# Patient Record
Sex: Female | Born: 1970 | Race: White | Hispanic: No | Marital: Married
Health system: Southern US, Community
[De-identification: ages and names within clinical notes are randomized; demographics above are authoritative.]

---

## 1998-01-09 ENCOUNTER — Other Ambulatory Visit: Admission: RE | Admit: 1998-01-09 | Discharge: 1998-01-09 | Payer: Self-pay | Admitting: Obstetrics & Gynecology

## 1998-08-22 ENCOUNTER — Ambulatory Visit (HOSPITAL_COMMUNITY): Admission: RE | Admit: 1998-08-22 | Discharge: 1998-08-22 | Payer: Self-pay | Admitting: Obstetrics and Gynecology

## 1998-11-16 ENCOUNTER — Inpatient Hospital Stay (HOSPITAL_COMMUNITY): Admission: AD | Admit: 1998-11-16 | Discharge: 1998-11-19 | Payer: Self-pay | Admitting: Obstetrics & Gynecology

## 1998-11-19 ENCOUNTER — Encounter (HOSPITAL_COMMUNITY): Admission: RE | Admit: 1998-11-19 | Discharge: 1999-02-17 | Payer: Self-pay | Admitting: Obstetrics & Gynecology

## 1998-12-29 ENCOUNTER — Other Ambulatory Visit: Admission: RE | Admit: 1998-12-29 | Discharge: 1998-12-29 | Payer: Self-pay | Admitting: Obstetrics and Gynecology

## 1999-10-03 ENCOUNTER — Encounter: Payer: Self-pay | Admitting: Family Medicine

## 1999-10-03 ENCOUNTER — Encounter: Admission: RE | Admit: 1999-10-03 | Discharge: 1999-10-03 | Payer: Self-pay | Admitting: Family Medicine

## 1999-11-22 ENCOUNTER — Ambulatory Visit (HOSPITAL_COMMUNITY): Admission: RE | Admit: 1999-11-22 | Discharge: 1999-11-23 | Payer: Self-pay

## 2000-01-16 ENCOUNTER — Other Ambulatory Visit: Admission: RE | Admit: 2000-01-16 | Discharge: 2000-01-16 | Payer: Self-pay | Admitting: Obstetrics & Gynecology

## 2001-01-26 ENCOUNTER — Other Ambulatory Visit: Admission: RE | Admit: 2001-01-26 | Discharge: 2001-01-26 | Payer: Self-pay | Admitting: Obstetrics & Gynecology

## 2002-03-22 ENCOUNTER — Other Ambulatory Visit: Admission: RE | Admit: 2002-03-22 | Discharge: 2002-03-22 | Payer: Self-pay | Admitting: Obstetrics & Gynecology

## 2003-01-12 ENCOUNTER — Encounter: Payer: Self-pay | Admitting: Family Medicine

## 2003-01-12 ENCOUNTER — Ambulatory Visit (HOSPITAL_COMMUNITY): Admission: RE | Admit: 2003-01-12 | Discharge: 2003-01-12 | Payer: Self-pay | Admitting: Family Medicine

## 2004-05-10 ENCOUNTER — Other Ambulatory Visit: Admission: RE | Admit: 2004-05-10 | Discharge: 2004-05-10 | Payer: Self-pay | Admitting: Obstetrics & Gynecology

## 2004-05-19 ENCOUNTER — Emergency Department (HOSPITAL_COMMUNITY): Admission: EM | Admit: 2004-05-19 | Discharge: 2004-05-20 | Payer: Self-pay | Admitting: Emergency Medicine

## 2004-05-31 ENCOUNTER — Ambulatory Visit (HOSPITAL_COMMUNITY): Admission: RE | Admit: 2004-05-31 | Discharge: 2004-05-31 | Payer: Self-pay | Admitting: Obstetrics & Gynecology

## 2005-07-04 ENCOUNTER — Other Ambulatory Visit: Admission: RE | Admit: 2005-07-04 | Discharge: 2005-07-04 | Payer: Self-pay | Admitting: Obstetrics & Gynecology

## 2010-10-21 ENCOUNTER — Encounter: Payer: Self-pay | Admitting: Obstetrics and Gynecology

## 2011-06-26 ENCOUNTER — Other Ambulatory Visit: Payer: Self-pay | Admitting: Obstetrics & Gynecology

## 2014-01-28 ENCOUNTER — Ambulatory Visit
Admission: RE | Admit: 2014-01-28 | Discharge: 2014-01-28 | Disposition: A | Discharge status: Home / Self Care | Payer: BC Managed Care – PPO | Source: Ambulatory Visit | Attending: Family Medicine | Admitting: Family Medicine

## 2014-01-28 ENCOUNTER — Other Ambulatory Visit: Payer: Self-pay | Admitting: Family Medicine

## 2014-01-28 DIAGNOSIS — R319 Hematuria, unspecified: Secondary | ICD-10-CM

## 2016-04-18 ENCOUNTER — Other Ambulatory Visit: Payer: Self-pay | Admitting: Obstetrics & Gynecology

## 2016-04-22 LAB — CYTOLOGY - PAP

## 2017-09-02 ENCOUNTER — Other Ambulatory Visit: Payer: Self-pay | Admitting: Obstetrics & Gynecology

## 2017-09-02 DIAGNOSIS — R928 Other abnormal and inconclusive findings on diagnostic imaging of breast: Secondary | ICD-10-CM

## 2017-09-19 ENCOUNTER — Other Ambulatory Visit: Payer: BC Managed Care – PPO

## 2017-09-24 ENCOUNTER — Ambulatory Visit
Admission: RE | Admit: 2017-09-24 | Discharge: 2017-09-24 | Disposition: A | Discharge status: Home / Self Care | Payer: BC Managed Care – PPO | Source: Ambulatory Visit | Attending: Obstetrics & Gynecology | Admitting: Obstetrics & Gynecology

## 2017-09-24 DIAGNOSIS — R928 Other abnormal and inconclusive findings on diagnostic imaging of breast: Secondary | ICD-10-CM

## 2018-11-08 IMAGING — MG 2D DIGITAL DIAGNOSTIC UNILATERAL RIGHT MAMMOGRAM WITH CAD AND AD
6 series · 6 of 14 positions shown · non-contrast
Comparison: Previous exam(s).

CLINICAL DATA: 46-year-old female for further evaluation of
possible right breast mass on screening mammogram.

EXAM:
2D DIGITAL DIAGNOSTIC RIGHT MAMMOGRAM AND ADJUNCT TOMO
ULTRASOUND RIGHT BREAST

[R MLO synth-2D]
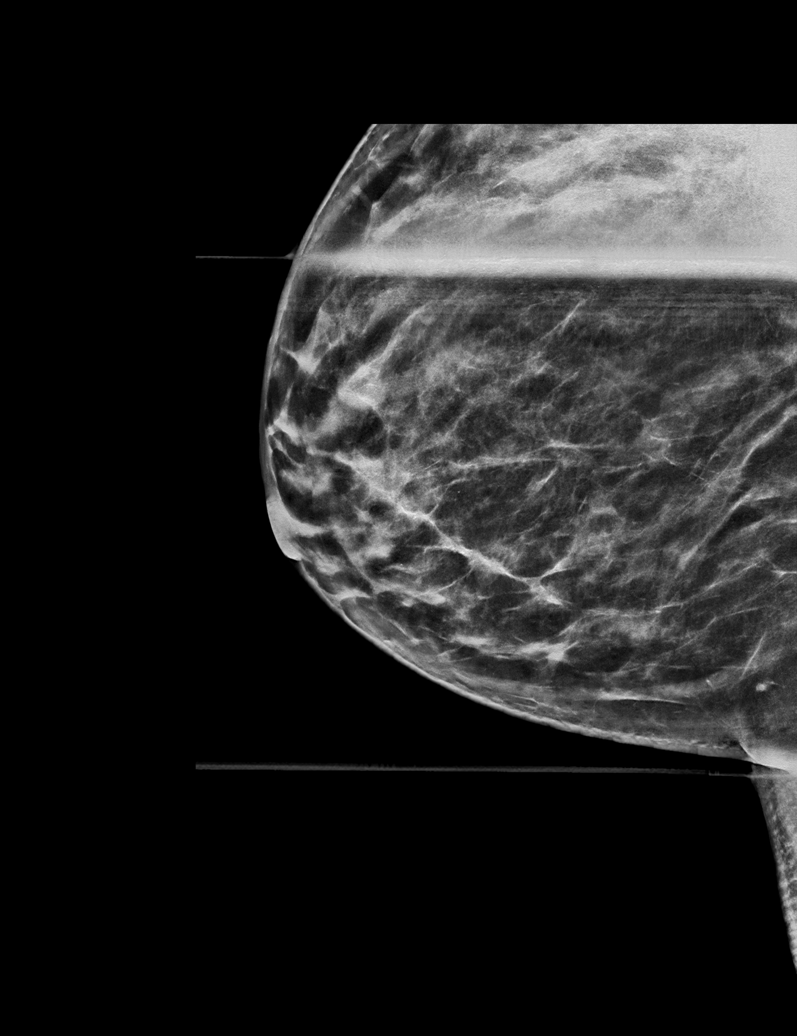

[R MLO]
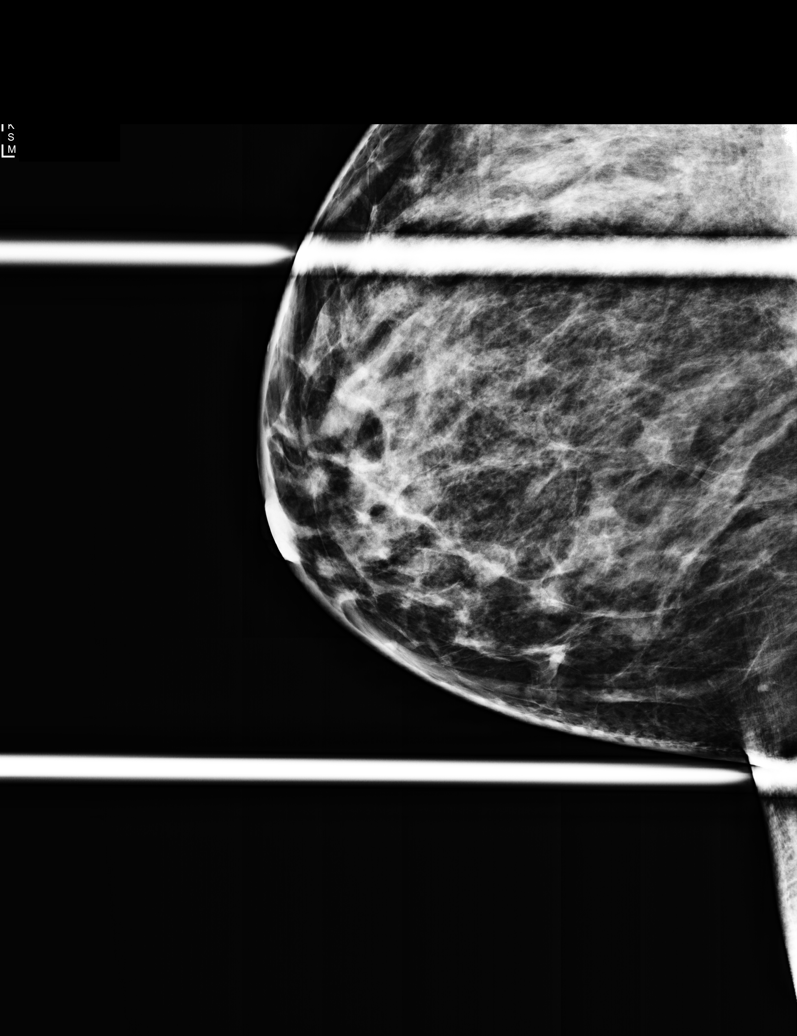

[R CC]
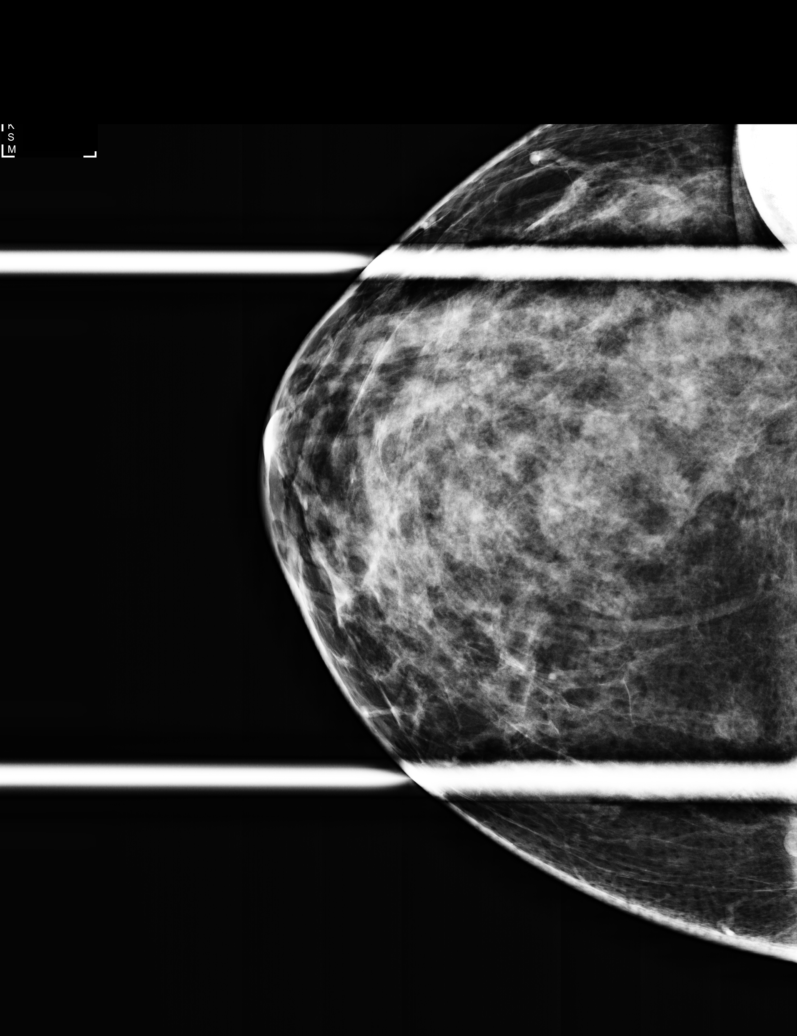

[R CC synth-2D]
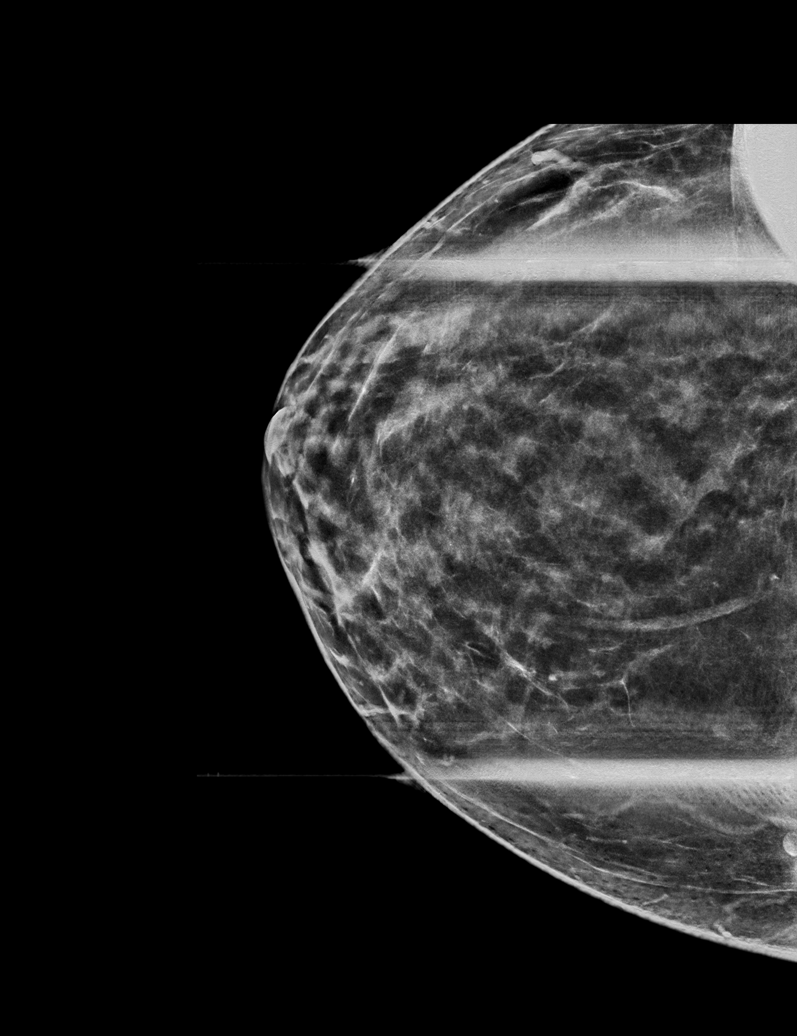

[R CC tomo · tomo slice 37/74.0]
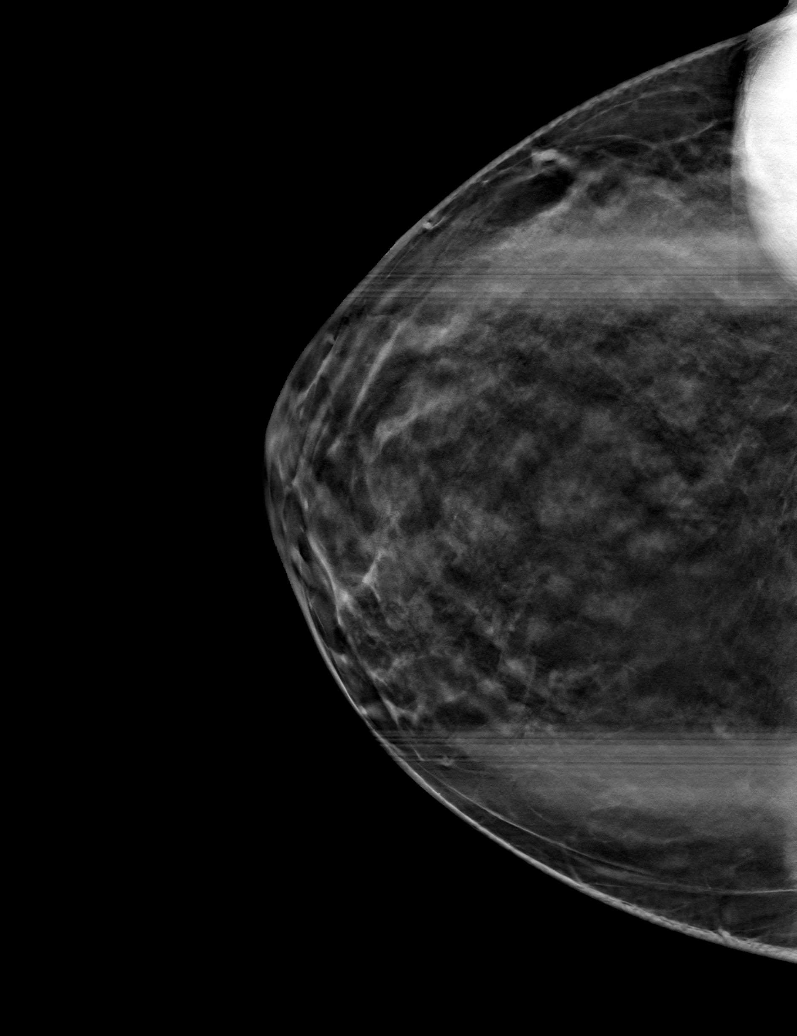

[R MLO tomo · tomo slice 37/73.0]
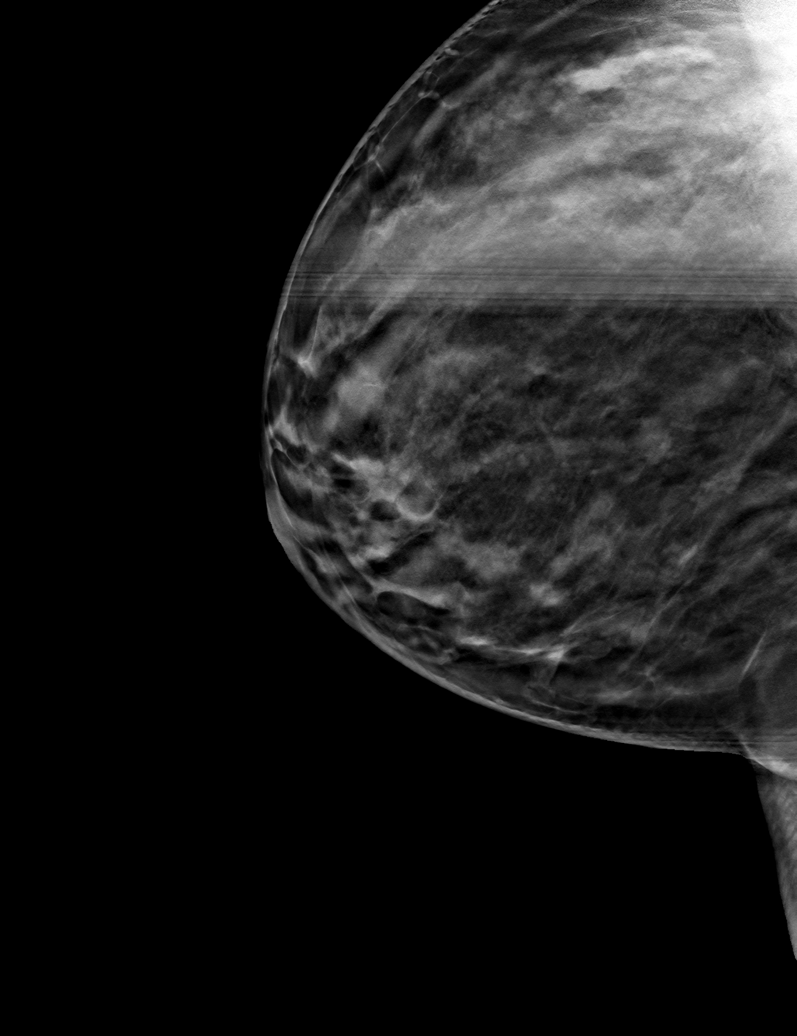

[6 of 14 positions shown; findings below may reference images not displayed]

ACR Breast Density Category c: The breast tissue is heterogeneously
dense, which may obscure small masses.
FINDINGS: 2D and 3D spot compression views of the right breast demonstrate no
persistent abnormality in the area of the screening study finding.
However, heterogeneously dense tissue in this area slightly limits
sensitivity.

Targeted ultrasound is performed, showing no solid or cystic mass,
distortion or abnormal shadowing within the entire right breast.
IMPRESSION: No persistent suspicious mammographic or sonographic abnormality
within the right breast.

RECOMMENDATION:
Bilateral screening mammograms in 1 year.

I have discussed the findings and recommendations with the patient.
Results were also provided in writing at the conclusion of the
visit. If applicable, a reminder letter will be sent to the patient
regarding the next appointment.

BI-RADS CATEGORY  1: Negative.

## 2020-10-13 ENCOUNTER — Other Ambulatory Visit: Payer: Self-pay | Admitting: Obstetrics and Gynecology

## 2020-10-13 DIAGNOSIS — R1030 Lower abdominal pain, unspecified: Secondary | ICD-10-CM

## 2020-10-18 ENCOUNTER — Ambulatory Visit
Admission: RE | Admit: 2020-10-18 | Discharge: 2020-10-18 | Disposition: A | Discharge status: Home / Self Care | Payer: BC Managed Care – PPO | Source: Ambulatory Visit | Attending: Obstetrics and Gynecology | Admitting: Obstetrics and Gynecology

## 2020-10-18 DIAGNOSIS — R1030 Lower abdominal pain, unspecified: Secondary | ICD-10-CM

## 2021-12-02 IMAGING — US US PELVIS COMPLETE WITH TRANSVAGINAL
2 series · 13 of 25 positions shown · non-contrast
Comparison: None

CLINICAL DATA: Generalized pelvic pain for 2 months, LMP 10/01/2020



[Series 1: us pelvis complete with transvaginal · 0.25mm/px · 12 of 52 slices shown (1 of 2)]
[im 1/52]
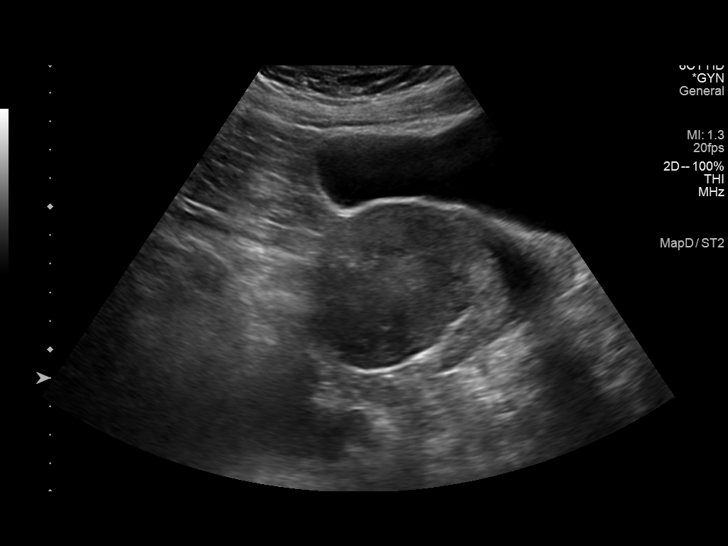
[im 5/52]
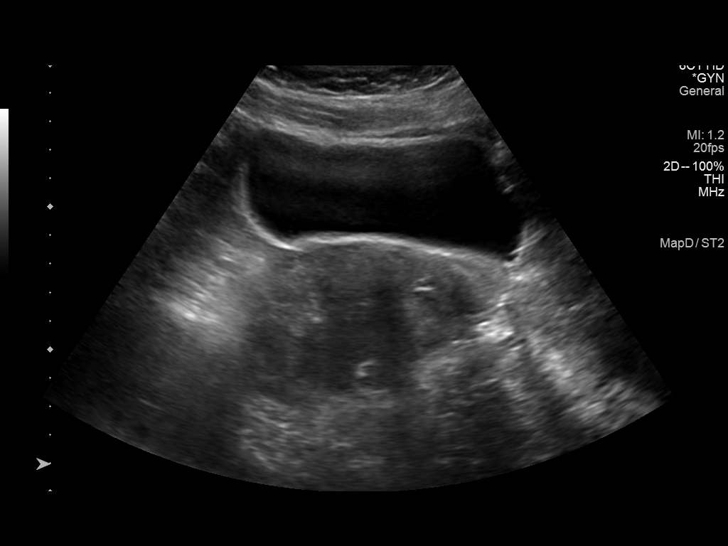
[im 10/52]
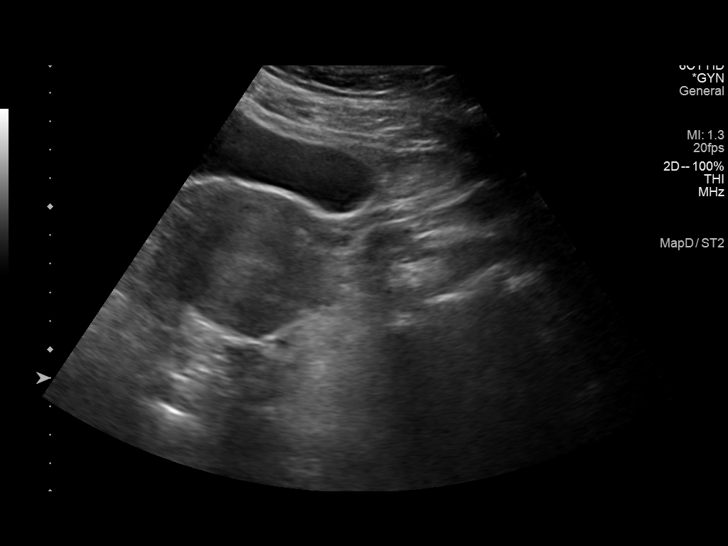
[im 14/52]
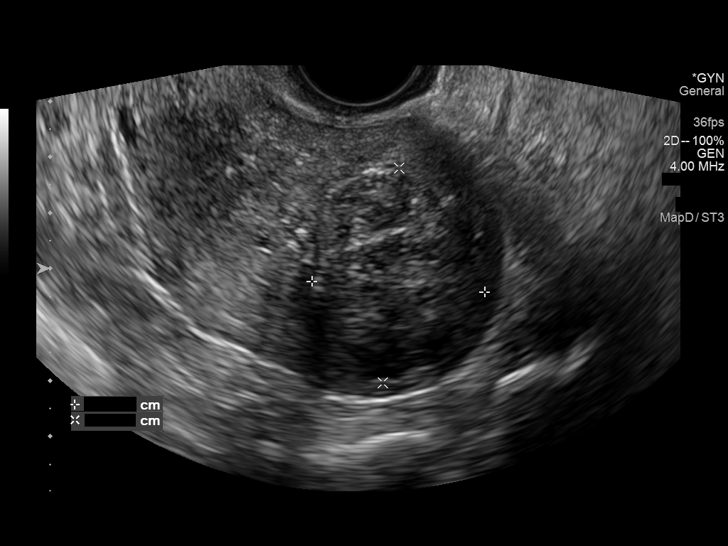
[im 19/52]
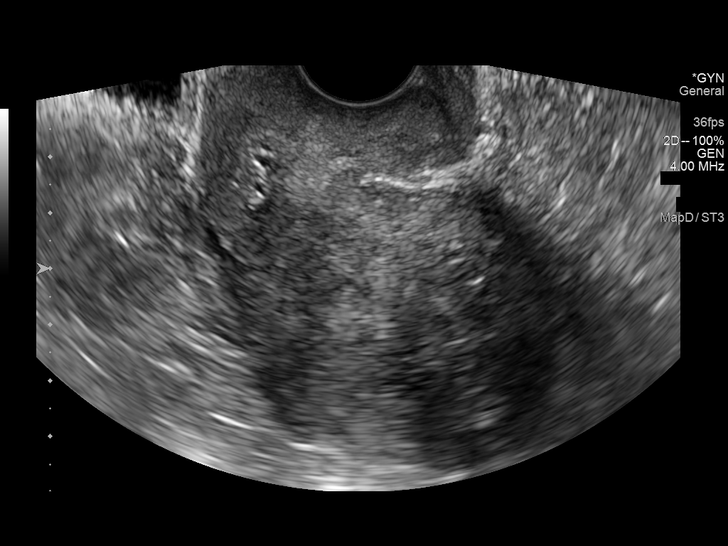
[im 24/52]
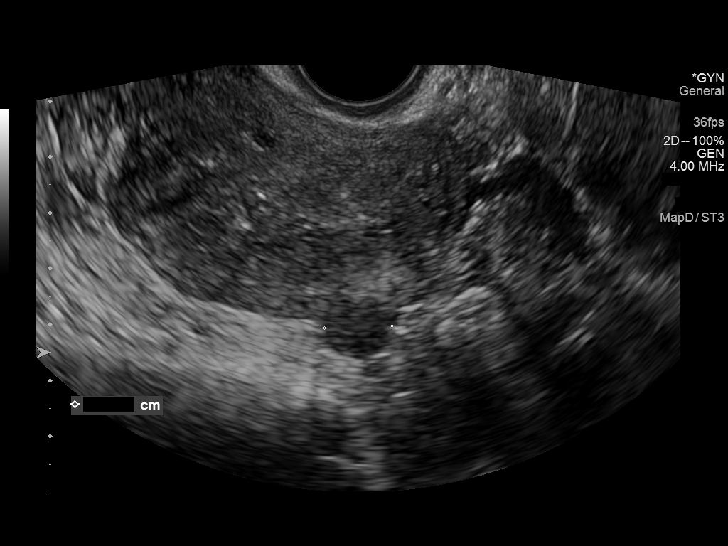
[im 28/52]
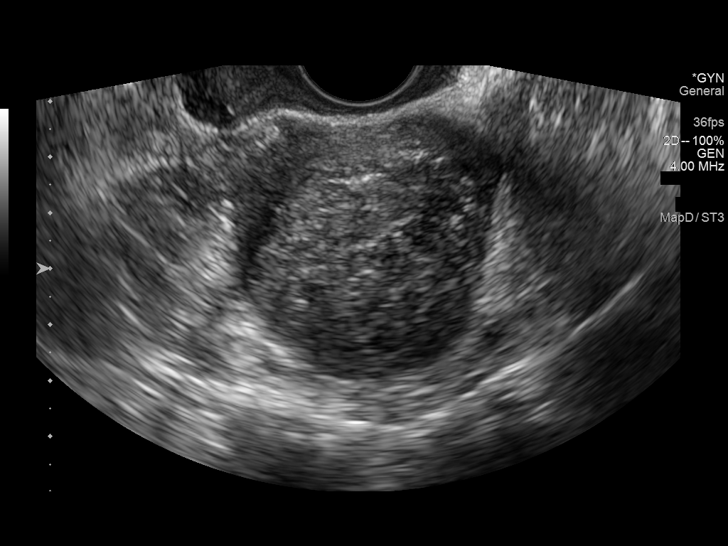
[im 33/52]
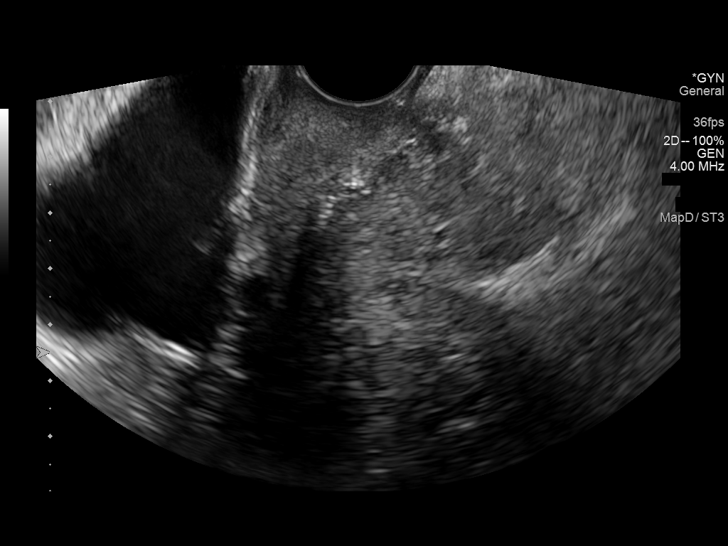
[im 38/52]
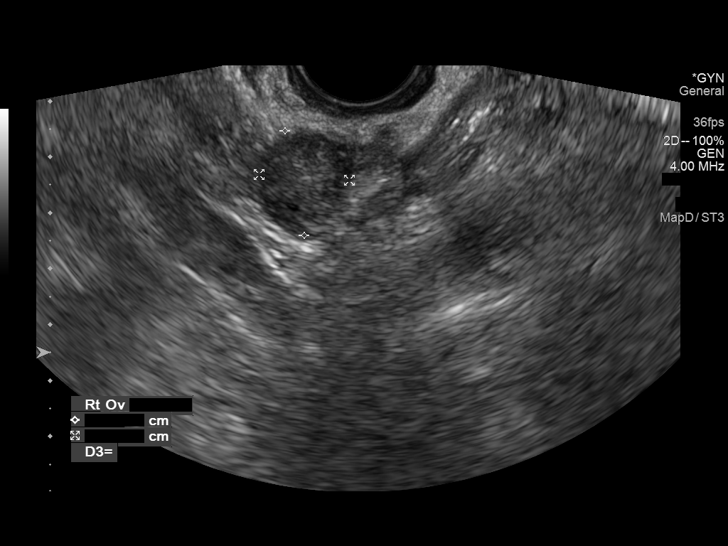
[im 42/52]
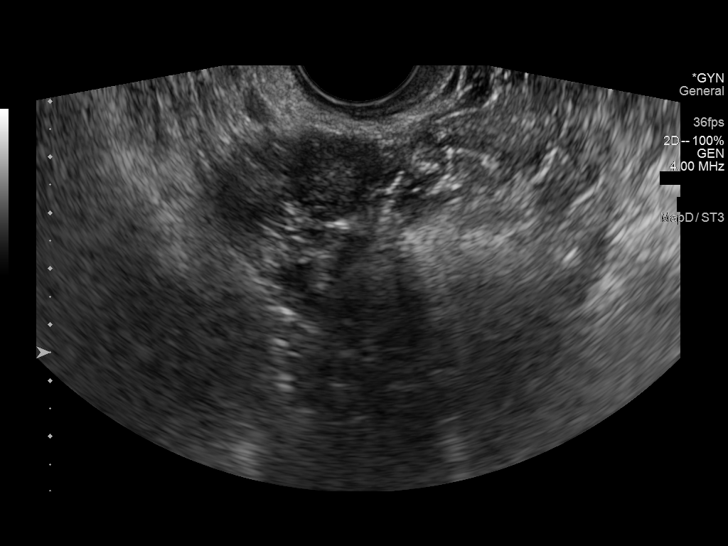
[im 47/52]
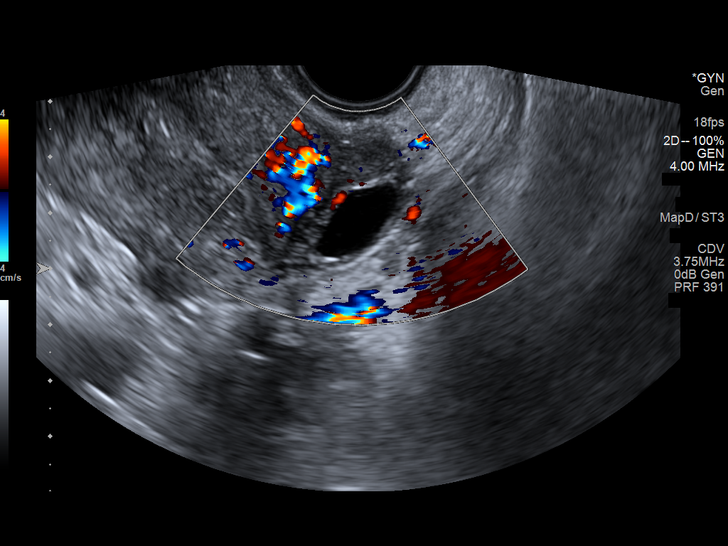
[im 52/52]
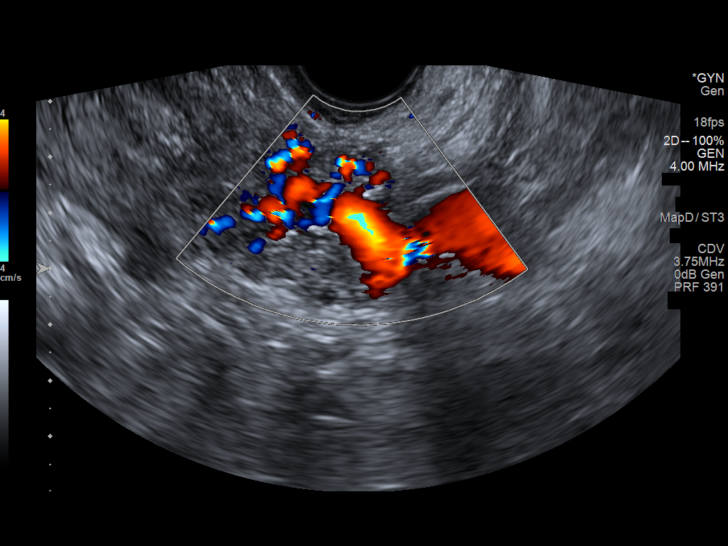

[Series 1001: us pelvis complete with transvaginal · 0.13mm/px · 1 of 4 slices shown (2 of 2)]
[im 4/4]
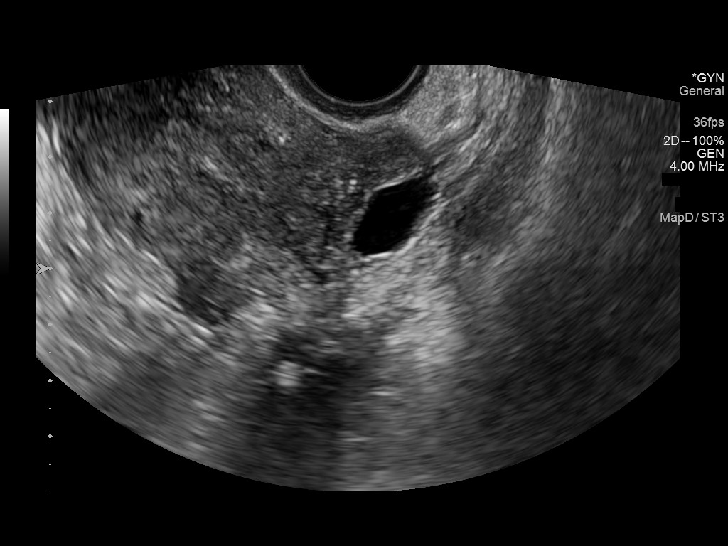

[13 of 25 positions shown; findings below may reference images not displayed]

FINDINGS: Uterus

Measurements: 8.7 x 5.4 x 6.4 cm = volume: 157 mL. Retroverted.
Intramural leiomyoma at uterine fundus 3.1 x 3.9 x 4.1 cm,
heterogeneous echogenicity. Small anterior wall leiomyoma subserosal
at upper uterus 1.7 x 1.1 x 1.2 cm. No additional masses.

Endometrium

Thickness: 7 mm.  No endometrial fluid or focal abnormality

Right ovary

Measurements: 1.9 x 1.6 x 1.6 cm = volume: 2.5 mL. Normal morphology
without mass

Left ovary

Measurements: 2.9 x 1.8 x 1.9 cm = volume: 5.0 mL. Normal morphology
without mass

Other findings

No free pelvic fluid.  No adnexal masses.
IMPRESSION: Two uterine leiomyomata as above, larger measuring 4.1 cm diameter
at uterine fundus.

Remainder of exam unremarkable.

## 2022-07-02 ENCOUNTER — Other Ambulatory Visit (HOSPITAL_BASED_OUTPATIENT_CLINIC_OR_DEPARTMENT_OTHER): Payer: Self-pay | Admitting: Family Medicine

## 2022-07-02 DIAGNOSIS — R748 Abnormal levels of other serum enzymes: Secondary | ICD-10-CM

## 2023-06-12 ENCOUNTER — Other Ambulatory Visit: Payer: Self-pay | Admitting: Obstetrics and Gynecology

## 2023-06-12 DIAGNOSIS — D259 Leiomyoma of uterus, unspecified: Secondary | ICD-10-CM
